# Patient Record
Sex: Female | Born: 1945 | Race: White | Hispanic: No | Smoking: Current every day smoker
Health system: Southern US, Community
[De-identification: ages and names within clinical notes are randomized; demographics above are authoritative.]

## PROBLEM LIST (undated history)

## (undated) DIAGNOSIS — M4316 Spondylolisthesis, lumbar region: Secondary | ICD-10-CM

## (undated) DIAGNOSIS — N200 Calculus of kidney: Secondary | ICD-10-CM

## (undated) DIAGNOSIS — E785 Hyperlipidemia, unspecified: Secondary | ICD-10-CM

## (undated) DIAGNOSIS — Z87440 Personal history of urinary (tract) infections: Secondary | ICD-10-CM

## (undated) DIAGNOSIS — E559 Vitamin D deficiency, unspecified: Secondary | ICD-10-CM

## (undated) DIAGNOSIS — J45909 Unspecified asthma, uncomplicated: Secondary | ICD-10-CM

## (undated) DIAGNOSIS — E876 Hypokalemia: Secondary | ICD-10-CM

## (undated) DIAGNOSIS — M1990 Unspecified osteoarthritis, unspecified site: Secondary | ICD-10-CM

## (undated) DIAGNOSIS — F419 Anxiety disorder, unspecified: Secondary | ICD-10-CM

## (undated) DIAGNOSIS — M48062 Spinal stenosis, lumbar region with neurogenic claudication: Secondary | ICD-10-CM

## (undated) DIAGNOSIS — N952 Postmenopausal atrophic vaginitis: Secondary | ICD-10-CM

## (undated) DIAGNOSIS — F32A Depression, unspecified: Secondary | ICD-10-CM

## (undated) DIAGNOSIS — N3941 Urge incontinence: Secondary | ICD-10-CM

## (undated) DIAGNOSIS — I10 Essential (primary) hypertension: Secondary | ICD-10-CM

## (undated) DIAGNOSIS — M7062 Trochanteric bursitis, left hip: Secondary | ICD-10-CM

## (undated) DIAGNOSIS — N182 Chronic kidney disease, stage 2 (mild): Secondary | ICD-10-CM

## (undated) DIAGNOSIS — E039 Hypothyroidism, unspecified: Secondary | ICD-10-CM

## (undated) HISTORY — PX: TUBAL LIGATION: SHX77

---

## 2021-06-03 ENCOUNTER — Encounter: Payer: Self-pay | Admitting: Neurosurgery

## 2021-06-03 ENCOUNTER — Encounter
Admission: RE | Admit: 2021-06-03 | Discharge: 2021-06-03 | Disposition: A | Discharge status: Home / Self Care | Payer: Medicare Other | Source: Ambulatory Visit | Attending: Neurosurgery | Admitting: Neurosurgery

## 2021-06-03 ENCOUNTER — Other Ambulatory Visit: Payer: Self-pay | Admitting: Neurosurgery

## 2021-06-03 DIAGNOSIS — Z01818 Encounter for other preprocedural examination: Secondary | ICD-10-CM | POA: Insufficient documentation

## 2021-06-03 DIAGNOSIS — N182 Chronic kidney disease, stage 2 (mild): Secondary | ICD-10-CM | POA: Diagnosis not present

## 2021-06-03 DIAGNOSIS — I129 Hypertensive chronic kidney disease with stage 1 through stage 4 chronic kidney disease, or unspecified chronic kidney disease: Secondary | ICD-10-CM | POA: Insufficient documentation

## 2021-06-03 DIAGNOSIS — I10 Essential (primary) hypertension: Secondary | ICD-10-CM

## 2021-06-03 DIAGNOSIS — E876 Hypokalemia: Secondary | ICD-10-CM | POA: Diagnosis not present

## 2021-06-03 DIAGNOSIS — Z01810 Encounter for preprocedural cardiovascular examination: Secondary | ICD-10-CM

## 2021-06-03 DIAGNOSIS — Z01812 Encounter for preprocedural laboratory examination: Secondary | ICD-10-CM

## 2021-06-03 DIAGNOSIS — E039 Hypothyroidism, unspecified: Secondary | ICD-10-CM | POA: Diagnosis not present

## 2021-06-03 DIAGNOSIS — E785 Hyperlipidemia, unspecified: Secondary | ICD-10-CM | POA: Diagnosis not present

## 2021-06-03 DIAGNOSIS — M48062 Spinal stenosis, lumbar region with neurogenic claudication: Secondary | ICD-10-CM | POA: Diagnosis not present

## 2021-06-03 HISTORY — DX: Spinal stenosis, lumbar region with neurogenic claudication: M48.062

## 2021-06-03 HISTORY — DX: Anxiety disorder, unspecified: F41.9

## 2021-06-03 HISTORY — DX: Depression, unspecified: F32.A

## 2021-06-03 HISTORY — DX: Hyperlipidemia, unspecified: E78.5

## 2021-06-03 HISTORY — DX: Unspecified osteoarthritis, unspecified site: M19.90

## 2021-06-03 HISTORY — DX: Unspecified asthma, uncomplicated: J45.909

## 2021-06-03 HISTORY — DX: Hypothyroidism, unspecified: E03.9

## 2021-06-03 HISTORY — DX: Essential (primary) hypertension: I10

## 2021-06-03 LAB — BASIC METABOLIC PANEL
Anion gap: 10 (ref 5–15)
BUN: 13 mg/dL (ref 8–23)
CO2: 27 mmol/L (ref 22–32)
Calcium: 9.6 mg/dL (ref 8.9–10.3)
Chloride: 103 mmol/L (ref 98–111)
Creatinine, Ser: 0.79 mg/dL (ref 0.44–1.00)
GFR, Estimated: >60 mL/min (ref >60)
Glucose, Bld: 98 mg/dL (ref 70–99)
Potassium: 4.2 mmol/L (ref 3.5–5.1)
Sodium: 140 mmol/L (ref 135–145)

## 2021-06-03 LAB — URINALYSIS, COMPLETE (UACMP) WITH MICROSCOPIC
Bacteria, UA: NONE SEEN
Bilirubin Urine: NEGATIVE
Glucose, UA: NEGATIVE mg/dL
Ketones, ur: NEGATIVE mg/dL
Leukocytes,Ua: NEGATIVE
Nitrite: NEGATIVE
Protein, ur: NEGATIVE mg/dL
Specific Gravity, Urine: 1.011 (ref 1.005–1.030)
pH: 6 (ref 5.0–8.0)

## 2021-06-03 LAB — CBC
HCT: 39.6 % (ref 36.0–46.0)
Hemoglobin: 13.3 g/dL (ref 12.0–15.0)
MCH: 30.9 pg (ref 26.0–34.0)
MCHC: 33.6 g/dL (ref 30.0–36.0)
MCV: 92.1 fL (ref 80.0–100.0)
Platelets: 202 10*3/uL (ref 150–400)
RBC: 4.3 MIL/uL (ref 3.87–5.11)
RDW: 12.4 % (ref 11.5–15.5)
WBC: 6.9 10*3/uL (ref 4.0–10.5)
nRBC: 0 % (ref 0.0–0.2)

## 2021-06-03 LAB — TYPE AND SCREEN
ABO/RH(D): A POS
Antibody Screen: NEGATIVE

## 2021-06-03 LAB — SURGICAL PCR SCREEN
MRSA, PCR: NEGATIVE
Staphylococcus aureus: NEGATIVE

## 2021-06-11 ENCOUNTER — Encounter
Admission: RE | Admit: 2021-06-11 | Discharge: 2021-06-11 | Disposition: A | Discharge status: Home / Self Care | Payer: Medicare Other | Source: Ambulatory Visit | Attending: Neurosurgery | Admitting: Neurosurgery

## 2021-06-11 ENCOUNTER — Other Ambulatory Visit: Payer: Self-pay

## 2021-06-11 NOTE — Patient Instructions (Addendum)
Your procedure is scheduled on:5/31/ 2023  Report to the Registration Desk on the 1st floor of the [facility]. To find out your arrival time, please call [phone] between 1PM - 3PM on: 06/17/2021  If your arrival time is 6:00 am, do not arrive prior to that time as the [facility] entrance doors do not open until 6:00 am.  REMEMBER: Instructions that are not followed completely may result in serious medical risk, up to and including death; or upon the discretion of your surgeon and anesthesiologist your surgery may need to be rescheduled.  Do not eat food after midnight the night before surgery.  No gum chewing, lozengers or hard candies.  You may however, drink CLEAR liquids up to 2 hours before you are scheduled to arrive for your surgery. Do not drink anything within 2 hours of your scheduled arrival time.  Clear liquids include: - water  - apple juice without pulp - gatorade (not RED colors) - black coffee or tea (Do NOT add milk or creamers to the coffee or tea) Do NOT drink anything that is not on this list.    TAKE THESE MEDICATIONS THE MORNING OF SURGERY WITH A SIP OF WATER: Wellbutrin Tapazole Omeprazole Lyrica Crestor Zanaflex  DO NOT TAKE LOSARTAN on day of surgery because this can interact with anesthesia.    Use inhaler as prescribed on the day of surgery and bring to the hospital.  Use flonase as prescribed on day of surgery.  . One week prior to surgery: Stop Anti-inflammatories (NSAIDS) such as Advil, Aleve, Ibuprofen, Motrin, Naproxen, Naprosyn and Aspirin based products such as Excedrin, Goodys Powder, BC Powder. Stop ANY OVER THE COUNTER supplements until after surgery. You may however, continue to take Tylenol if needed for pain up until the day of surgery.  No Alcohol for 24 hours before or after surgery.  No Smoking including e-cigarettes for 24 hours prior to surgery.  No chewable tobacco products for at least 6 hours prior to surgery.  No  nicotine patches on the day of surgery.  Do not use any "recreational" drugs for at least a week prior to your surgery.  Please be advised that the combination of cocaine and anesthesia may have negative outcomes, up to and including death. If you test positive for cocaine, your surgery will be cancelled.  On the morning of surgery brush your teeth with toothpaste and water, you may rinse your mouth with mouthwash if you wish. Do not swallow any toothpaste or mouthwash.  Use CHG Soap as directed on instruction sheet.  Do not wear jewelry, make-up, hairpins, clips or nail polish.  Do not wear lotions, powders, or perfumes.   Do not shave body from the neck down 48 hours prior to surgery just in case you cut yourself which could leave a site for infection.  Also, freshly shaved skin may become irritated if using the CHG soap.  Contact lenses, hearing aids and dentures may not be worn into surgery.  Do not bring valuables to the hospital. [facility] is not responsible for any missing/lost belongings or valuables.    Notify your doctor if there is any change in your medical condition (cold, fever, infection).  Wear comfortable clothing (specific to your surgery type) to the hospital.  After surgery, you can help prevent lung complications by doing breathing exercises.  Take deep breaths and cough every 1-2 hours. Your doctor may order a device called an Incentive Spirometer to help you take deep breaths. When  coughing or sneezing, hold a pillow firmly against your incision with both hands. This is called "splinting." Doing this helps protect your incision. It also decreases belly discomfort.  If you are being admitted to the hospital overnight, leave your suitcase in the car. After surgery it may be brought to your room.  If you are being discharged the day of surgery, you will not be allowed to drive home. You will need a responsible adult (18 years or older) to drive you home and  stay with you that night.   If you are taking public transportation, you will need to have a responsible adult (18 years or older) with you. Please confirm with your physician that it is acceptable to use public transportation.   Please call the [facility] at [phone] if you have any questions about these instructions.  Surgery Visitation Policy:  Patients undergoing a surgery or procedure may have two family members or support persons with them as long as the person is not COVID-19 positive or experiencing its symptoms.   Inpatient Visitation:    Visiting hours are 7 a.m. to 8 p.m. Up to four visitors are allowed at one time in a patient room, including children. The visitors may rotate out with other people during the day. One designated support person (adult) may remain overnight.

## 2021-06-18 ENCOUNTER — Ambulatory Visit: Payer: Medicare Other | Admitting: Urgent Care

## 2021-06-18 ENCOUNTER — Encounter: Payer: Self-pay | Admitting: Neurosurgery

## 2021-06-18 ENCOUNTER — Ambulatory Visit: Payer: Medicare Other

## 2021-06-18 ENCOUNTER — Ambulatory Visit
Admission: RE | Admit: 2021-06-18 | Discharge: 2021-06-18 | Disposition: A | Discharge status: Home / Self Care | Payer: Medicare Other | Attending: Neurosurgery | Admitting: Neurosurgery

## 2021-06-18 ENCOUNTER — Other Ambulatory Visit: Payer: Self-pay

## 2021-06-18 ENCOUNTER — Encounter
Admission: RE | Disposition: A | Discharge status: Home / Self Care | Payer: Self-pay | Source: Home / Self Care | Attending: Neurosurgery

## 2021-06-18 DIAGNOSIS — E876 Hypokalemia: Secondary | ICD-10-CM

## 2021-06-18 DIAGNOSIS — I129 Hypertensive chronic kidney disease with stage 1 through stage 4 chronic kidney disease, or unspecified chronic kidney disease: Secondary | ICD-10-CM | POA: Insufficient documentation

## 2021-06-18 DIAGNOSIS — Z01810 Encounter for preprocedural cardiovascular examination: Secondary | ICD-10-CM

## 2021-06-18 DIAGNOSIS — M48062 Spinal stenosis, lumbar region with neurogenic claudication: Secondary | ICD-10-CM | POA: Diagnosis not present

## 2021-06-18 DIAGNOSIS — F17210 Nicotine dependence, cigarettes, uncomplicated: Secondary | ICD-10-CM | POA: Diagnosis not present

## 2021-06-18 DIAGNOSIS — I10 Essential (primary) hypertension: Secondary | ICD-10-CM

## 2021-06-18 DIAGNOSIS — N182 Chronic kidney disease, stage 2 (mild): Secondary | ICD-10-CM | POA: Diagnosis not present

## 2021-06-18 DIAGNOSIS — E039 Hypothyroidism, unspecified: Secondary | ICD-10-CM

## 2021-06-18 DIAGNOSIS — Z01812 Encounter for preprocedural laboratory examination: Secondary | ICD-10-CM

## 2021-06-18 DIAGNOSIS — E785 Hyperlipidemia, unspecified: Secondary | ICD-10-CM

## 2021-06-18 DIAGNOSIS — J449 Chronic obstructive pulmonary disease, unspecified: Secondary | ICD-10-CM | POA: Diagnosis not present

## 2021-06-18 HISTORY — PX: LUMBAR LAMINECTOMY/DECOMPRESSION MICRODISCECTOMY: SHX5026

## 2021-06-18 HISTORY — DX: Urge incontinence: N39.41

## 2021-06-18 HISTORY — DX: Personal history of urinary (tract) infections: Z87.440

## 2021-06-18 HISTORY — DX: Calculus of kidney: N20.0

## 2021-06-18 HISTORY — DX: Chronic kidney disease, stage 2 (mild): N18.2

## 2021-06-18 HISTORY — DX: Trochanteric bursitis, left hip: M70.62

## 2021-06-18 HISTORY — DX: Vitamin D deficiency, unspecified: E55.9

## 2021-06-18 HISTORY — DX: Postmenopausal atrophic vaginitis: N95.2

## 2021-06-18 HISTORY — DX: Spondylolisthesis, lumbar region: M43.16

## 2021-06-18 HISTORY — DX: Hypokalemia: E87.6

## 2021-06-18 LAB — TYPE AND SCREEN
ABO/RH(D): A POS
Antibody Screen: NEGATIVE

## 2021-06-18 SURGERY — LUMBAR LAMINECTOMY/DECOMPRESSION MICRODISCECTOMY 1 LEVEL
Anesthesia: General

## 2021-06-18 MED ORDER — ROCURONIUM BROMIDE 100 MG/10ML IV SOLN
INTRAVENOUS | Status: DC | PRN
  Administered 2021-06-18: 10 mg via INTRAVENOUS

## 2021-06-18 MED ORDER — BUPIVACAINE LIPOSOME 1.3 % IJ SUSP
INTRAMUSCULAR | Status: AC
Start: 2021-06-18 — End: ?
  Filled 2021-06-18: qty 20

## 2021-06-18 MED ORDER — PROPOFOL 10 MG/ML IV BOLUS
INTRAVENOUS | Status: DC | PRN
  Administered 2021-06-18: 160 mg via INTRAVENOUS
  Administered 2021-06-18: 40 mg via INTRAVENOUS

## 2021-06-18 MED ORDER — TIZANIDINE HCL 2 MG PO CAPS: 2.0000 mg | ORAL_CAPSULE | Freq: Three times a day (TID) | ORAL | 0 refills | Status: AC

## 2021-06-18 MED ORDER — GLYCOPYRROLATE 0.2 MG/ML IJ SOLN
INTRAMUSCULAR | Status: AC
Start: 2021-06-18 — End: ?
  Filled 2021-06-18: qty 1

## 2021-06-18 MED ORDER — CEFAZOLIN SODIUM-DEXTROSE 2-4 GM/100ML-% IV SOLN
2.0000 g | INTRAVENOUS | Status: AC
  Administered 2021-06-18: 2 g via INTRAVENOUS

## 2021-06-18 MED ORDER — EPHEDRINE 5 MG/ML INJ
INTRAVENOUS | Status: AC
  Filled 2021-06-18: qty 5

## 2021-06-18 MED ORDER — CHLORHEXIDINE GLUCONATE 0.12 % MT SOLN: 15.0000 mL | Freq: Once | OROMUCOSAL | Status: AC

## 2021-06-18 MED ORDER — KETAMINE HCL 10 MG/ML IJ SOLN
INTRAMUSCULAR | Status: DC | PRN
  Administered 2021-06-18 (×2): 25 mg via INTRAVENOUS

## 2021-06-18 MED ORDER — ACETAMINOPHEN 10 MG/ML IV SOLN
INTRAVENOUS | Status: AC
  Filled 2021-06-18: qty 100

## 2021-06-18 MED ORDER — SODIUM CHLORIDE FLUSH 0.9 % IV SOLN
INTRAVENOUS | Status: AC
  Filled 2021-06-18: qty 20

## 2021-06-18 MED ORDER — FENTANYL CITRATE (PF) 100 MCG/2ML IJ SOLN: 25.0000 ug | INTRAMUSCULAR | Status: DC | PRN

## 2021-06-18 MED ORDER — BUPIVACAINE HCL (PF) 0.5 % IJ SOLN
INTRAMUSCULAR | Status: AC
  Filled 2021-06-18: qty 30

## 2021-06-18 MED ORDER — EPHEDRINE SULFATE (PRESSORS) 50 MG/ML IJ SOLN
INTRAMUSCULAR | Status: DC | PRN
  Administered 2021-06-18 (×2): 10 mg via INTRAVENOUS
  Administered 2021-06-18: 5 mg via INTRAVENOUS

## 2021-06-18 MED ORDER — DEXAMETHASONE SODIUM PHOSPHATE 10 MG/ML IJ SOLN
INTRAMUSCULAR | Status: AC
  Filled 2021-06-18: qty 1

## 2021-06-18 MED ORDER — METHYLPREDNISOLONE ACETATE 40 MG/ML IJ SUSP
INTRAMUSCULAR | Status: AC
  Filled 2021-06-18: qty 1

## 2021-06-18 MED ORDER — SUCCINYLCHOLINE CHLORIDE 200 MG/10ML IV SOSY
PREFILLED_SYRINGE | INTRAVENOUS | Status: DC | PRN
  Administered 2021-06-18: 100 mg via INTRAVENOUS

## 2021-06-18 MED ORDER — PHENYLEPHRINE HCL-NACL 20-0.9 MG/250ML-% IV SOLN
INTRAVENOUS | Status: DC | PRN
  Administered 2021-06-18: 50 ug/min via INTRAVENOUS

## 2021-06-18 MED ORDER — LIDOCAINE HCL (CARDIAC) PF 100 MG/5ML IV SOSY
PREFILLED_SYRINGE | INTRAVENOUS | Status: DC | PRN
  Administered 2021-06-18: 50 mg via INTRAVENOUS

## 2021-06-18 MED ORDER — SEVOFLURANE IN SOLN
RESPIRATORY_TRACT | Status: AC
Start: 2021-06-18 — End: ?
  Filled 2021-06-18: qty 250

## 2021-06-18 MED ORDER — PHENYLEPHRINE 80 MCG/ML (10ML) SYRINGE FOR IV PUSH (FOR BLOOD PRESSURE SUPPORT)
PREFILLED_SYRINGE | INTRAVENOUS | Status: AC
  Filled 2021-06-18: qty 10

## 2021-06-18 MED ORDER — ONDANSETRON HCL 4 MG/2ML IJ SOLN
INTRAMUSCULAR | Status: AC
  Filled 2021-06-18: qty 2

## 2021-06-18 MED ORDER — OXYCODONE HCL 5 MG PO TABS: 5.0000 mg | ORAL_TABLET | Freq: Once | ORAL | Status: DC | PRN

## 2021-06-18 MED ORDER — 0.9 % SODIUM CHLORIDE (POUR BTL) OPTIME
TOPICAL | Status: DC | PRN
  Administered 2021-06-18: 500 mL

## 2021-06-18 MED ORDER — BUPIVACAINE LIPOSOME 1.3 % IJ SUSP
INTRAMUSCULAR | Status: AC
  Filled 2021-06-18: qty 20

## 2021-06-18 MED ORDER — ORAL CARE MOUTH RINSE: 15.0000 mL | Freq: Once | OROMUCOSAL | Status: AC

## 2021-06-18 MED ORDER — BUPIVACAINE-EPINEPHRINE (PF) 0.5% -1:200000 IJ SOLN
INTRAMUSCULAR | Status: AC
  Filled 2021-06-18: qty 30

## 2021-06-18 MED ORDER — DEXAMETHASONE SODIUM PHOSPHATE 10 MG/ML IJ SOLN
INTRAMUSCULAR | Status: DC | PRN
  Administered 2021-06-18: 10 mg via INTRAVENOUS

## 2021-06-18 MED ORDER — OXYCODONE-ACETAMINOPHEN 5-325 MG PO TABS: 1.0000 | ORAL_TABLET | Freq: Four times a day (QID) | ORAL | 0 refills | Status: AC | PRN

## 2021-06-18 MED ORDER — FENTANYL CITRATE (PF) 100 MCG/2ML IJ SOLN
INTRAMUSCULAR | Status: AC
  Filled 2021-06-18: qty 2

## 2021-06-18 MED ORDER — KETAMINE HCL 50 MG/5ML IJ SOSY
PREFILLED_SYRINGE | INTRAMUSCULAR | Status: AC
  Filled 2021-06-18: qty 5

## 2021-06-18 MED ORDER — ONDANSETRON HCL 4 MG/2ML IJ SOLN
INTRAMUSCULAR | Status: DC | PRN
  Administered 2021-06-18: 4 mg via INTRAVENOUS

## 2021-06-18 MED ORDER — ACETAMINOPHEN 10 MG/ML IV SOLN
INTRAVENOUS | Status: DC | PRN
  Administered 2021-06-18: 1000 mg via INTRAVENOUS

## 2021-06-18 MED ORDER — MIDAZOLAM HCL 2 MG/2ML IJ SOLN
INTRAMUSCULAR | Status: DC | PRN
  Administered 2021-06-18: 2 mg via INTRAVENOUS

## 2021-06-18 MED ORDER — CEFAZOLIN SODIUM-DEXTROSE 2-4 GM/100ML-% IV SOLN
INTRAVENOUS | Status: AC
  Filled 2021-06-18: qty 100

## 2021-06-18 MED ORDER — GLYCOPYRROLATE 0.2 MG/ML IJ SOLN
INTRAMUSCULAR | Status: DC | PRN
  Administered 2021-06-18: .2 mg via INTRAVENOUS

## 2021-06-18 MED ORDER — SENNA 8.6 MG PO TABS
1.0000 | ORAL_TABLET | Freq: Every day | ORAL | 0 refills | Status: DC | PRN
Start: 2021-06-18 — End: 2021-07-31

## 2021-06-18 MED ORDER — SODIUM CHLORIDE (PF) 0.9 % IJ SOLN
INTRAMUSCULAR | Status: DC | PRN
  Administered 2021-06-18: 60 mL

## 2021-06-18 MED ORDER — PHENYLEPHRINE HCL (PRESSORS) 10 MG/ML IV SOLN
INTRAVENOUS | Status: DC | PRN
  Administered 2021-06-18: 160 ug via INTRAVENOUS
  Administered 2021-06-18: 80 ug via INTRAVENOUS
  Administered 2021-06-18 (×2): 160 ug via INTRAVENOUS
  Administered 2021-06-18: 80 ug via INTRAVENOUS

## 2021-06-18 MED ORDER — FENTANYL CITRATE (PF) 100 MCG/2ML IJ SOLN
INTRAMUSCULAR | Status: DC | PRN
  Administered 2021-06-18 (×2): 50 ug via INTRAVENOUS

## 2021-06-18 MED ORDER — ROCURONIUM BROMIDE 10 MG/ML (PF) SYRINGE
PREFILLED_SYRINGE | INTRAVENOUS | Status: AC
  Filled 2021-06-18: qty 10

## 2021-06-18 MED ORDER — SURGIFLO WITH THROMBIN (HEMOSTATIC MATRIX KIT) OPTIME
TOPICAL | Status: DC | PRN
  Administered 2021-06-18: 1 via TOPICAL

## 2021-06-18 MED ORDER — MIDAZOLAM HCL 2 MG/2ML IJ SOLN
INTRAMUSCULAR | Status: AC
  Filled 2021-06-18: qty 2

## 2021-06-18 MED ORDER — METHYLPREDNISOLONE ACETATE 40 MG/ML IJ SUSP
INTRAMUSCULAR | Status: DC | PRN
  Administered 2021-06-18: 40 mg

## 2021-06-18 MED ORDER — BUPIVACAINE-EPINEPHRINE (PF) 0.5% -1:200000 IJ SOLN
INTRAMUSCULAR | Status: DC | PRN
  Administered 2021-06-18: 4 mL

## 2021-06-18 MED ORDER — OXYCODONE HCL 5 MG/5ML PO SOLN: 5.0000 mg | Freq: Once | ORAL | Status: DC | PRN

## 2021-06-18 MED ORDER — LACTATED RINGERS IV SOLN: INTRAVENOUS | Status: DC

## 2021-06-18 MED ORDER — CHLORHEXIDINE GLUCONATE 0.12 % MT SOLN
OROMUCOSAL | Status: AC
  Administered 2021-06-18: 15 mL via OROMUCOSAL
  Filled 2021-06-18: qty 15

## 2021-06-18 MED ORDER — DEXMEDETOMIDINE HCL IN NACL 200 MCG/50ML IV SOLN
INTRAVENOUS | Status: DC | PRN
  Administered 2021-06-18: 12 ug via INTRAVENOUS

## 2021-06-18 SURGICAL SUPPLY — 55 items
BUR NEURO DRILL SOFT 3.0X3.8M (BURR) ×2 IMPLANT
CHLORAPREP W/TINT 26 (MISCELLANEOUS) ×4 IMPLANT
CNTNR SPEC 2.5X3XGRAD LEK (MISCELLANEOUS) ×1
CONT SPEC 4OZ STER OR WHT (MISCELLANEOUS) ×1
CONTAINER SPEC 2.5X3XGRAD LEK (MISCELLANEOUS) ×1 IMPLANT
COUNTER NEEDLE 20/40 LG (NEEDLE) ×2 IMPLANT
CUP MEDICINE 2OZ PLAST GRAD ST (MISCELLANEOUS) ×4 IMPLANT
DERMABOND ADVANCED (GAUZE/BANDAGES/DRESSINGS) ×1
DERMABOND ADVANCED .7 DNX12 (GAUZE/BANDAGES/DRESSINGS) ×1 IMPLANT
DRAPE C ARM PK CFD 31 SPINE (DRAPES) ×2 IMPLANT
DRAPE LAPAROTOMY 100X77 ABD (DRAPES) ×2 IMPLANT
DRAPE MICROSCOPE SPINE 48X150 (DRAPES) ×2 IMPLANT
DRAPE SURG 17X11 SM STRL (DRAPES) ×2 IMPLANT
ELECT CAUTERY BLADE TIP 2.5 (TIP) ×2
ELECT EZSTD 165MM 6.5IN (MISCELLANEOUS)
ELECT REM PT RETURN 9FT ADLT (ELECTROSURGICAL) ×2
ELECTRODE CAUTERY BLDE TIP 2.5 (TIP) ×1 IMPLANT
ELECTRODE EZSTD 165MM 6.5IN (MISCELLANEOUS) IMPLANT
ELECTRODE REM PT RTRN 9FT ADLT (ELECTROSURGICAL) ×1 IMPLANT
GLOVE BIOGEL PI IND STRL 6.5 (GLOVE) ×1 IMPLANT
GLOVE BIOGEL PI IND STRL 8.5 (GLOVE) ×1 IMPLANT
GLOVE BIOGEL PI INDICATOR 6.5 (GLOVE) ×1
GLOVE BIOGEL PI INDICATOR 8.5 (GLOVE) ×1
GLOVE SURG SYN 6.5 ES PF (GLOVE) ×4 IMPLANT
GLOVE SURG SYN 6.5 PF PI (GLOVE) ×2 IMPLANT
GLOVE SURG SYN 8.5  E (GLOVE) ×3
GLOVE SURG SYN 8.5 E (GLOVE) ×3 IMPLANT
GLOVE SURG SYN 8.5 PF PI (GLOVE) ×3 IMPLANT
GOWN SRG LRG LVL 4 IMPRV REINF (GOWNS) ×1 IMPLANT
GOWN SRG XL LVL 3 NONREINFORCE (GOWNS) ×1 IMPLANT
GOWN STRL NON-REIN TWL XL LVL3 (GOWNS) ×1
GOWN STRL REIN LRG LVL4 (GOWNS) ×1
GRADUATE 1200CC STRL 31836 (MISCELLANEOUS) ×2 IMPLANT
GRAFT DURAGEN MATRIX 1WX1L (Tissue) IMPLANT
KIT SPINAL PRONEVIEW (KITS) ×2 IMPLANT
MANIFOLD NEPTUNE II (INSTRUMENTS) ×2 IMPLANT
MARKER SKIN DUAL TIP RULER LAB (MISCELLANEOUS) ×4 IMPLANT
NDL SAFETY ECLIPSE 18X1.5 (NEEDLE) ×1 IMPLANT
NEEDLE HYPO 18GX1.5 SHARP (NEEDLE) ×1
NEEDLE HYPO 22GX1.5 SAFETY (NEEDLE) ×2 IMPLANT
NS IRRIG 1000ML POUR BTL (IV SOLUTION) ×2 IMPLANT
PACK LAMINECTOMY NEURO (CUSTOM PROCEDURE TRAY) ×2 IMPLANT
PAD ARMBOARD 7.5X6 YLW CONV (MISCELLANEOUS) ×2 IMPLANT
SOLUTION IRRIG SURGIPHOR (IV SOLUTION) ×2 IMPLANT
SURGIFLO W/THROMBIN 8M KIT (HEMOSTASIS) ×2 IMPLANT
SUT DVC VLOC 3-0 CL 6 P-12 (SUTURE) ×2 IMPLANT
SUT VIC AB 0 CT1 27 (SUTURE) ×1
SUT VIC AB 0 CT1 27XCR 8 STRN (SUTURE) ×1 IMPLANT
SUT VIC AB 2-0 CT1 18 (SUTURE) ×2 IMPLANT
SYR 10ML LL (SYRINGE) ×2 IMPLANT
SYR 20ML LL LF (SYRINGE) ×2 IMPLANT
SYR 30ML LL (SYRINGE) ×4 IMPLANT
SYR 3ML LL SCALE MARK (SYRINGE) ×2 IMPLANT
TOWEL OR 17X26 4PK STRL BLUE (TOWEL DISPOSABLE) ×6 IMPLANT
TUBING CONNECTING 10 (TUBING) ×2 IMPLANT

## 2021-06-18 NOTE — Transfer of Care (Signed)
Immediate Anesthesia Transfer of Care Note  Patient: Suzanne Herman  Procedure(s) Performed: L4-5 DECOMPRESSION  Patient Location: PACU  Anesthesia Type:General  Level of Consciousness: sedated  Airway & Oxygen Therapy: Patient Spontanous Breathing and Patient connected to face mask oxygen  Post-op Assessment: Report given to RN and Post -op Vital signs reviewed and stable  Post vital signs: Reviewed  Last Vitals:  Vitals Value Taken Time  BP 127/62 06/18/21 0859  Temp    Pulse 99 06/18/21 0859  Resp 17 06/18/21 0859  SpO2 96 % 06/18/21 0859  Vitals shown include unvalidated device data.  Last Pain:         Complications: No notable events documented.

## 2021-06-18 NOTE — Op Note (Signed)
Indications: Suzanne Herman is a 76 yo female who presented with: Neurogenic claudication due to lumbar spinal stenosis M48.062  She failed conservative management prompting surgical intervention.  Findings: decompression at L4-5  Preoperative Diagnosis: Lumbar Stenosis with neurogenic claudication Postoperative Diagnosis: same   EBL: 10 ml IVF: 800 ml Drains: none Disposition: Extubated and Stable to PACU Complications: none  No foley catheter was placed.   Preoperative Note:   Risks of surgery discussed include: infection, bleeding, stroke, coma, death, paralysis, CSF leak, nerve/spinal cord injury, numbness, tingling, weakness, complex regional pain syndrome, recurrent stenosis and/or disc herniation, vascular injury, development of instability, neck/back pain, need for further surgery, persistent symptoms, development of deformity, and the risks of anesthesia. The patient understood these risks and agreed to proceed.  Operative Note:   1. L4-5 lumbar decompression including central laminectomy and bilateral medial facetectomies including foraminotomies  The patient was then brought from the preoperative center with intravenous access established.  The patient underwent general anesthesia and endotracheal tube intubation, and was then rotated on the Dodge rail top where all pressure points were appropriately padded.  The skin was then thoroughly cleansed.  Perioperative antibiotic prophylaxis was administered.  Sterile prep and drapes were then applied and a timeout was then observed.  C-arm was brought into the field under sterile conditions and under lateral visualization the L4-5 interspace was identified and marked.  The incision was marked on the left and injected with local anesthetic. Once this was complete a 3 cm incision was opened with the use of a #10 blade knife.    The metrx tubes were sequentially advanced and confirmed in position at L4-5. An 27mm by 71mm tube was  locked in place to the bed side attachment.  The microscope was then sterilely brought into the field and muscle creep was hemostased with a bipolar and resected with a pituitary rongeur.  A Bovie extender was then used to expose the spinous process and lamina.  Careful attention was placed to not violate the facet capsule. A 3 mm matchstick drill bit was then used to make a hemi-laminotomy trough until the ligamentum flavum was exposed.  This was extended to the base of the spinous process and to the contralateral side to remove all the central bone from each side.  Once this was complete and the underlying ligamentum flavum was visualized, it was dissected with a curette and resected with Kerrison rongeurs.  Extensive ligamentum hypertrophy was noted, requiring a substantial amount of time and care for removal.  The dura was identified and palpated. The kerrison rongeur was then used to remove the medial facet bilaterally until no compression was noted.  A balltip probe was used to confirm decompression of the ipsilateral L5 nerve root.  Additional attention was paid to completion of the contralateral L4-5 foraminotomy until the contralateral traversing nerve root was completely free.  Once this was complete, L4-5 central decompression including medial facetectomy and foraminotomy was confirmed and decompression on both sides was confirmed. No CSF leak was noted.  A Depo-Medrol soaked Gelfoam pledget was placed in the defect.  The wound was copiously irrigated. The tube system was then removed under microscopic visualization and hemostasis was obtained with a bipolar.    The fascial layer was reapproximated with the use of a 0 Vicryl suture.  Subcutaneous tissue layer was reapproximated using 2-0 Vicryl suture.  3-0 monocryl was placed in subcuticular fashion. The skin was then cleansed and Dermabond was used to close the skin opening.  Patient was then rotated back to the preoperative bed awakened from  anesthesia and taken to recovery all counts are correct in this case.  I performed the entire procedure with the assistance of Manning Charity PA as an Designer, television/film set.  Nomar Broad K. Myer Haff MD

## 2021-06-18 NOTE — Progress Notes (Signed)
PHARMACY -  BRIEF ANTIBIOTIC NOTE   Pharmacy has received consult(s) for Cefazolin from an OR provider.  The patient's profile has been reviewed for ht/wt/allergies/indication/available labs.    One time order(s) placed for Cefazolin 2 gm per pt wt: 68.9 kg  Further antibiotics/pharmacy consults should be ordered by admitting physician if indicated.                       Thank you, Otelia Sergeant, PharmD, [facility] 06/18/2021 1:24 AM

## 2021-06-18 NOTE — H&P (Signed)
I have reviewed and confirmed my history and physical from 06/03/21 with no additions or changes. Plan for L4-5 decompression.  Risks and benefits reviewed.  Heart sounds normal no MRG. Chest Clear to Auscultation Bilaterally.

## 2021-06-18 NOTE — Anesthesia Procedure Notes (Signed)
Procedure Name: Intubation Date/Time: 06/18/2021 7:35 AM Performed by: Rolla Plate, CRNA Pre-anesthesia Checklist: Patient identified, Patient being monitored, Timeout performed, Emergency Drugs available and Suction available Patient Re-evaluated:Patient Re-evaluated prior to induction Oxygen Delivery Method: Circle system utilized Preoxygenation: Pre-oxygenation with 100% oxygen Induction Type: IV induction Ventilation: Mask ventilation without difficulty Laryngoscope Size: 3 and McGraph Grade View: Grade I Tube type: Oral Tube size: 6.5 mm Number of attempts: 1 Airway Equipment and Method: Stylet and Video-laryngoscopy Placement Confirmation: ETT inserted through vocal cords under direct vision, positive ETCO2 and breath sounds checked- equal and bilateral Secured at: 21 cm Tube secured with: Tape Dental Injury: Teeth and Oropharynx as per pre-operative assessment

## 2021-06-18 NOTE — Discharge Summary (Signed)
Physician Discharge Summary  Patient ID: Suzanne Herman MRN: [id number] DOB/AGE: [birth year] 76 y.o.  Admit date: 06/18/2021 Discharge date: 06/18/2021  Admission Diagnoses: Neurogenic claudication due to lumbar spinal stenosis M48.062  Discharge Diagnoses:  Active Problems:   * No active hospital problems. *   Discharged Condition: good  Hospital Course:  Suzanne Herman is a 76 y.o s/p L4-5 lumbar decompression. Her intraoperative course was uncomplicated. She was monitored in PACU postoperatively and discharged home after ambulating, urinating, and tolerating PO intake. She was given prescriptions for pain medication and muscle relaxers.   Consults: None  Significant Diagnostic Studies: none  Treatments: surgery: as above. Please see separately dictated operative report for further details  Discharge Exam: Blood pressure 127/71, pulse 78, temperature 97.7 F (36.5 C), temperature source Temporal, resp. rate 16, height 5\' 1"  (1.549 m), weight 68.9 kg, SpO2 97 %. CN II-XII grossly intact 5/5 throughout BLE Incision c/d/I with dressing in place  Disposition: Discharge disposition: 01-Home or Self Care       Discharge Instructions     Increase activity slowly   Complete by: As directed       Allergies as of 06/18/2021       Reactions   Sulfa Antibiotics Rash        Medication List     TAKE these medications    albuterol 108 (90 Base) MCG/ACT inhaler Commonly known as: VENTOLIN HFA Inhale 1-2 puffs into the lungs every 4 (four) hours as needed for wheezing or shortness of breath.   buPROPion 150 MG 24 hr tablet Commonly known as: WELLBUTRIN XL Take 150 mg by mouth daily.   fluticasone 50 MCG/ACT nasal spray Commonly known as: FLONASE Place 1 spray into both nostrils daily.   ibuprofen 400 MG tablet Commonly known as: ADVIL Take 400 mg by mouth every 6 (six) hours as needed.   losartan 50 MG tablet Commonly known as: COZAAR Take 50 mg by  mouth daily.   methimazole 5 MG tablet Commonly known as: TAPAZOLE Take 5 mg by mouth daily.   omeprazole 40 MG capsule Commonly known as: PRILOSEC Take 40 mg by mouth daily.   oxyCODONE-acetaminophen 5-325 MG tablet Commonly known as: Percocet Take 1 tablet by mouth every 6 (six) hours as needed for up to 5 days for severe pain.   pregabalin 50 MG capsule Commonly known as: LYRICA Take 50 mg by mouth daily.   rosuvastatin 10 MG tablet Commonly known as: CRESTOR Take 10 mg by mouth daily.   senna 8.6 MG Tabs tablet Commonly known as: SENOKOT Take 1 tablet (8.6 mg total) by mouth daily as needed.   tizanidine 2 MG capsule Commonly known as: Zanaflex Take 1 capsule (2 mg total) by mouth 3 (three) times daily. What changed:  medication strength how much to take when to take this        Follow-up Information     06/20/2021, PA Follow up in 2 week(s).   Why: for post-op and incision check. This appointment date and time is on your pre-op paperwork Contact information: [address] [place] [phone]                 Signed: [phone] 06/18/2021, 10:42 AM

## 2021-06-18 NOTE — Anesthesia Preprocedure Evaluation (Signed)
Anesthesia Evaluation  Patient identified by MRN, date of birth, ID band Patient awake    Reviewed: Allergy & Precautions, NPO status , Patient's Chart, lab work & pertinent test results  History of Anesthesia Complications Negative for: history of anesthetic complications  Airway Mallampati: III  TM Distance: <3 FB Neck ROM: full    Dental  (+) Chipped, Poor Dentition, Missing   Pulmonary asthma , sleep apnea , COPD, Current Smoker,    Pulmonary exam normal        Cardiovascular Exercise Tolerance: Good hypertension, (-) angina(-) Past MI and (-) DOE Normal cardiovascular exam     Neuro/Psych PSYCHIATRIC DISORDERS negative neurological ROS     GI/Hepatic negative GI ROS, Neg liver ROS,   Endo/Other  Hypothyroidism   Renal/GU CRFRenal disease     Musculoskeletal   Abdominal   Peds  Hematology negative hematology ROS (+)   Anesthesia Other Findings Past Medical History: No date: Anterolisthesis of lumbar spine No date: Anxiety No date: Asthma No date: 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), stage II No date: Depression No date: History of recurrent UTI (urinary tract infection) No date: HLD (hyperlipidemia) No date: HTN (hypertension) No date: Hypothyroidism No date: Nephrolithiasis No date: Osteoarthritis No date: Spinal stenosis of lumbar region with neurogenic claudication No date: Trochanteric bursitis of left hip No date: Urge incontinence No date: Vitamin D deficiency  Past Surgical History: No date: TUBAL LIGATION  BMI    Body Mass Index: 28.72 kg/m      Reproductive/Obstetrics negative OB ROS                             Anesthesia Physical Anesthesia Plan  ASA: 3  Anesthesia Plan: General ETT   Post-op Pain Management:    Induction: Intravenous  PONV Risk Score and Plan: Ondansetron, Dexamethasone, Midazolam  and Treatment may vary due to age or medical condition  Airway Management Planned: Oral ETT  Additional Equipment:   Intra-op Plan:   Post-operative Plan: Extubation in OR  Informed Consent: I have reviewed the patients History and Physical, chart, labs and discussed the procedure including the risks, benefits and alternatives for the proposed anesthesia with the patient or authorized representative who has indicated his/her understanding and acceptance.     Dental Advisory Given  Plan Discussed with: Anesthesiologist, CRNA and Surgeon  Anesthesia Plan Comments: (Patient consented for risks of anesthesia including but not limited to:  - adverse reactions to medications - damage to eyes, teeth, lips or other oral mucosa - nerve damage due to positioning  - sore throat or hoarseness - Damage to heart, brain, nerves, lungs, other parts of body or loss of life  Patient voiced understanding.)        Anesthesia Quick Evaluation

## 2021-06-18 NOTE — Anesthesia Postprocedure Evaluation (Signed)
Anesthesia Post Note  Patient: Suzanne Herman  Procedure(s) Performed: L4-5 DECOMPRESSION  Patient location during evaluation: PACU Anesthesia Type: General Level of consciousness: awake and alert Pain management: pain level controlled Vital Signs Assessment: post-procedure vital signs reviewed and stable Respiratory status: spontaneous breathing, nonlabored ventilation, respiratory function stable and patient connected to nasal cannula oxygen Cardiovascular status: blood pressure returned to baseline and stable Postop Assessment: no apparent nausea or vomiting Anesthetic complications: no   No notable events documented.   Last Vitals:  Vitals:   06/18/21 0952 06/18/21 1046  BP: 127/71 127/68  Pulse: 78 79  Resp: 16 16  Temp: 36.5 C 36.5 C  SpO2: 97% 95%    Last Pain:  Vitals:   06/18/21 1046  TempSrc: Temporal  PainSc: 0-No pain                 Cleda Mccreedy [place]

## 2021-06-18 NOTE — Discharge Instructions (Addendum)
?Your surgeon has performed an operation on your lumbar spine (low back) to relieve pressure on one or more nerves. Many times, patients feel better immediately after surgery and can ?overdo it.? Even if you feel well, it is important that you follow these activity guidelines. If you do not let your back heal properly from the surgery, you can increase the chance of a disc herniation and/or return of your symptoms. The following are instructions to help in your recovery once you have been discharged from the hospital. ? ? ?Activity  ?  ?No bending, lifting, or twisting (?BLT?). Avoid lifting objects heavier than 10 pounds (gallon milk jug).  Where possible, avoid household activities that involve lifting, bending, pushing, or pulling such as laundry, vacuuming, grocery shopping, and childcare. Try to arrange for help from friends and family for these activities while your back heals. ? ?Increase physical activity slowly as tolerated.  Taking short walks is encouraged, but avoid strenuous exercise. Do not jog, run, bicycle, lift weights, or participate in any other exercises unless specifically allowed by your doctor. Avoid prolonged sitting, including car rides. ? ?Talk to your doctor before resuming sexual activity. ? ?You should not drive until cleared by your doctor. ? ?Until released by your doctor, you should not return to work or school.  You should rest at home and let your body heal.  ? ?You may shower two days after your surgery.  After showering, lightly dab your incision dry. Do not take a tub bath or go swimming for 3 weeks, or until approved by your doctor at your follow-up appointment. ? ?If you smoke, we strongly recommend that you quit.  Smoking has been proven to interfere with normal healing in your back and will dramatically reduce the success rate of your surgery. Please contact [facility] (800-QUIT-NOW) and use the resources at www.[facility].com for assistance in stopping smoking. ? ?Surgical  Incision ?  ?If you have a dressing on your incision, you may remove it three days after your surgery. Keep your incision area clean and dry. ? ?Your incision was closed with Dermabond glue. The glue should begin to peel away within about a week. ? ?Diet          ? ? You may return to your usual diet. Be sure to stay hydrated. ? ?When to Contact Us ? ?Although your surgery and recovery will likely be uneventful, you may have some residual numbness, aches, and pains in your back and/or legs. This is normal and should improve in the next few weeks. ? ?However, should you experience any of the following, contact us immediately: ?New numbness or weakness ?Pain that is progressively getting worse, and is not relieved by your pain medications or rest ?Bleeding, redness, swelling, pain, or drainage from surgical incision ?Chills or flu-like symptoms ?Fever greater than 101.0 F (38.3 C) ?Problems with bowel or bladder functions ?Difficulty breathing or shortness of breath ?Warmth, tenderness, or swelling in your calf ? ?Contact Information ?During office hours (Monday-Friday 9 am to 5 pm), please call your physician at [phone] ?After hours and weekends, please call [phone] and speak with the answering service, who will contact the doctor on call.  If that fails, call the [facility] Operator at [phone] and ask for the Neurosurgery Resident On Call  ?For a life-threatening emergency, call 911  ? ?AMBULATORY SURGERY  ?DISCHARGE INSTRUCTIONS ? ? ?The drugs that you were given will stay in your system until tomorrow so for the next 24 hours you should   not: ? ?Drive an automobile ?Make any legal decisions ?Drink any alcoholic beverage ? ? ?You may resume regular meals tomorrow.  Today it is better to start with liquids and gradually work up to solid foods. ? ?You may eat anything you prefer, but it is better to start with liquids, then soup and crackers, and gradually work up to solid foods. ? ? ?Please notify your  doctor immediately if you have any unusual bleeding, trouble breathing, redness and pain at the surgery site, drainage, fever, or pain not relieved by medication. ? ? ? ?Additional Instructions: ? ? ? ? ? ? ? ?Please contact your physician with any problems or Same Day Surgery at [phone], Monday through Friday 6 am to 4 pm, or [facility] Main number at [phone].  ?

## 2021-07-31 ENCOUNTER — Ambulatory Visit: Payer: Medicare Other | Admitting: Neurosurgery

## 2021-07-31 ENCOUNTER — Encounter: Payer: Self-pay | Admitting: Neurosurgery

## 2021-07-31 VITALS — BP 148/98 | Ht 61.0 in | Wt 149.0 lb

## 2021-07-31 DIAGNOSIS — M48062 Spinal stenosis, lumbar region with neurogenic claudication: Secondary | ICD-10-CM

## 2021-07-31 NOTE — Progress Notes (Signed)
   DOS: 06/18/21 (L4-5 decompression)  HISTORY OF PRESENT ILLNESS: 07/31/2021 [name] is status post L4 of decompression.  She is doing extremely well.   PHYSICAL EXAMINATION:   Vitals:   07/31/21 1147  BP: (!) 148/98   General: Patient is well developed, well nourished, calm, collected, and in no apparent distress.  NEUROLOGICAL:  General: In no acute distress.  Awake, alert, oriented to person, place, and time. Pupils equal round and reactive to light.   Strength:  Side Iliopsoas Quads Hamstring PF DF EHL  R 5 5 5 5 5 5   L 5 5 5 5 5 5    Incision c/d/i   ROS (Neurologic): Negative except as noted above  IMAGING: No interval imaging to review   ASSESSMENT/PLAN:  Suzanne Herman is doing well after lumbar decompression.  I am very pleased with her improvements.  She will begin increasing her activity level.  We reviewed her limitations.  I will see her back in 6 weeks.  I spent a total of 10 minutes in face-to-face and non-face-to-face activities related to this patient's care today.   MD, [facility] Department of Neurosurgery

## 2021-08-25 ENCOUNTER — Encounter: Payer: Self-pay | Admitting: Neurosurgery

## 2021-09-11 ENCOUNTER — Other Ambulatory Visit: Payer: Self-pay

## 2021-09-11 ENCOUNTER — Ambulatory Visit
Admission: RE | Admit: 2021-09-11 | Discharge: 2021-09-11 | Disposition: A | Discharge status: Home / Self Care | Payer: Self-pay | Source: Ambulatory Visit | Attending: Neurosurgery | Admitting: Neurosurgery

## 2021-09-11 DIAGNOSIS — Z049 Encounter for examination and observation for unspecified reason: Secondary | ICD-10-CM

## 2021-09-18 ENCOUNTER — Ambulatory Visit (INDEPENDENT_AMBULATORY_CARE_PROVIDER_SITE_OTHER): Payer: Medicare Other | Admitting: Neurosurgery

## 2021-09-18 DIAGNOSIS — M48062 Spinal stenosis, lumbar region with neurogenic claudication: Secondary | ICD-10-CM

## 2021-09-18 NOTE — Progress Notes (Signed)
   DOS: 06/18/21 (L4-5 decompression)  HISTORY OF PRESENT ILLNESS: 09/18/2021 She is walking up to 2 miles a day.  She feels good.  Ibuprofen takes care of her pain.  She does wake up with some pain when she is sleeping. She is much better than prior to surgery.  Her mother recently passed away.  She was 94.   08-18-21 [name] is status post L4-5 decompression.  She is doing extremely well.   PHYSICAL EXAMINATION:  No exam performed   IMAGING: No interval imaging to review   ASSESSMENT/PLAN:  Suzanne Herman is doing well after lumbar decompression.  I am very pleased with her improvements.    She has made significant improvements.  She is off activity restrictions.   I will see her back as need.   This visit was performed via telephone.  Patient location: home Provider location: office  I spent a total of 5 minutes non-face-to-face activities for this visit on the date of this encounter including review of current clinical condition and response to treatment.    Venetia Night MD, [facility] Department of Neurosurgery

## 2023-07-24 IMAGING — RF DG LUMBAR SPINE 2-3V
1 series · 5 of 5 positions shown · non-contrast
Comparison: None.

CLINICAL DATA: L4-L5 decompression

EXAM:
LUMBAR SPINE - 2-3 VIEW

[Series 1: dg x-ray · 0.20mm/px · 5 of 5 slices shown]
[im 1/5]
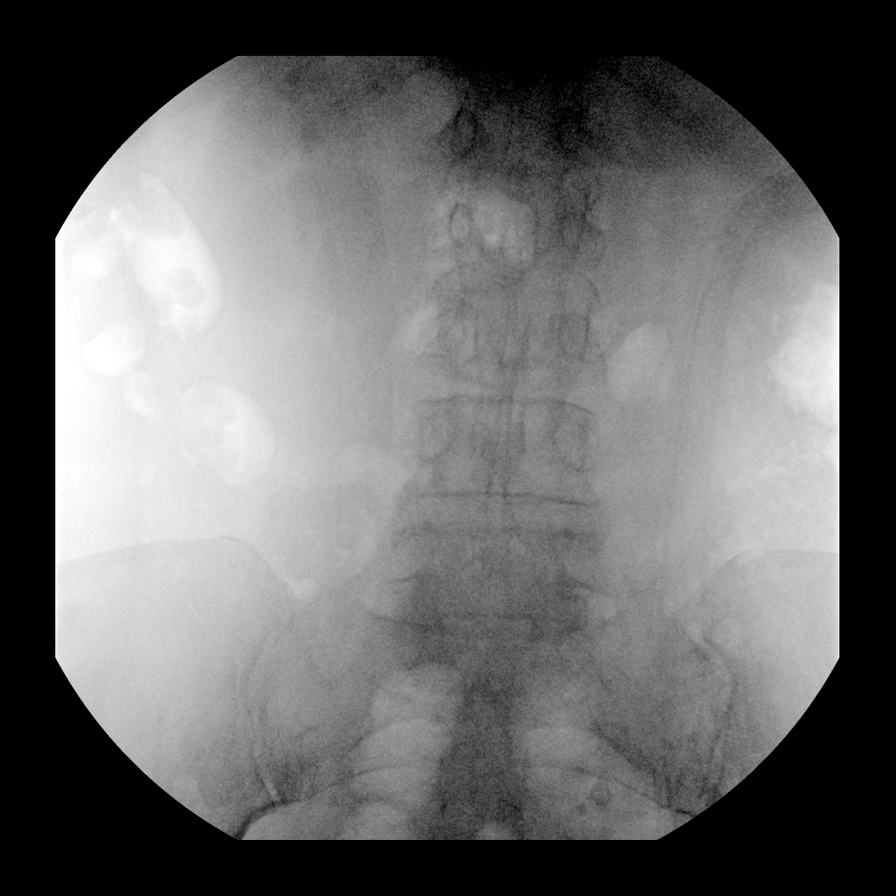
[im 2/5]
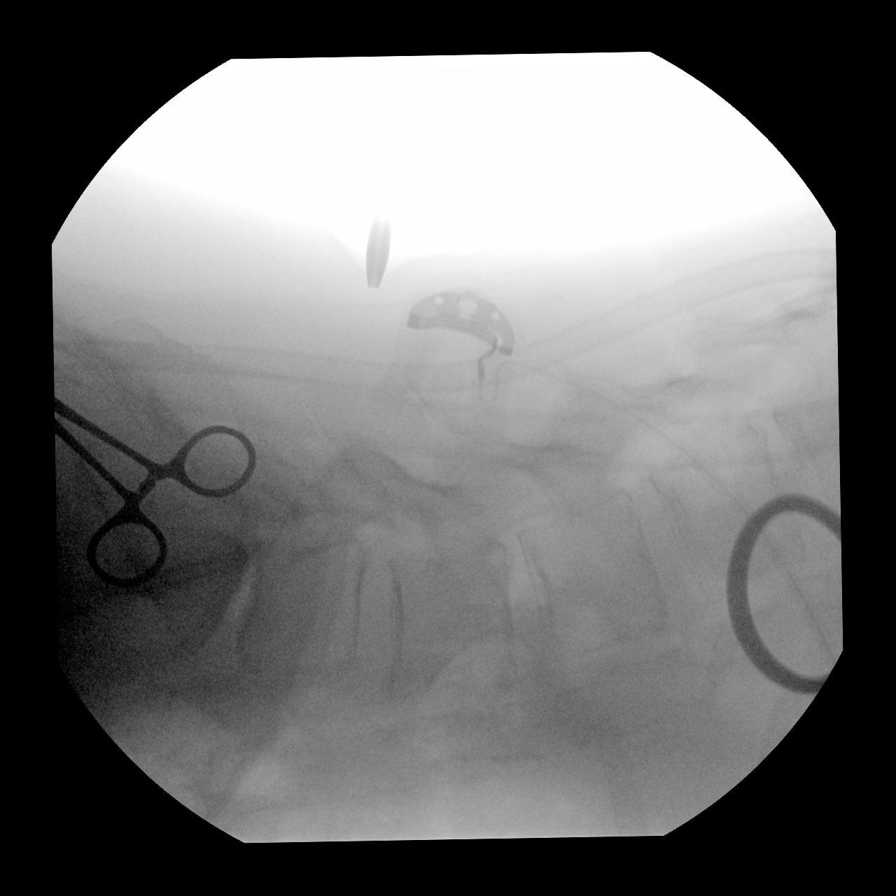
[im 3/5]
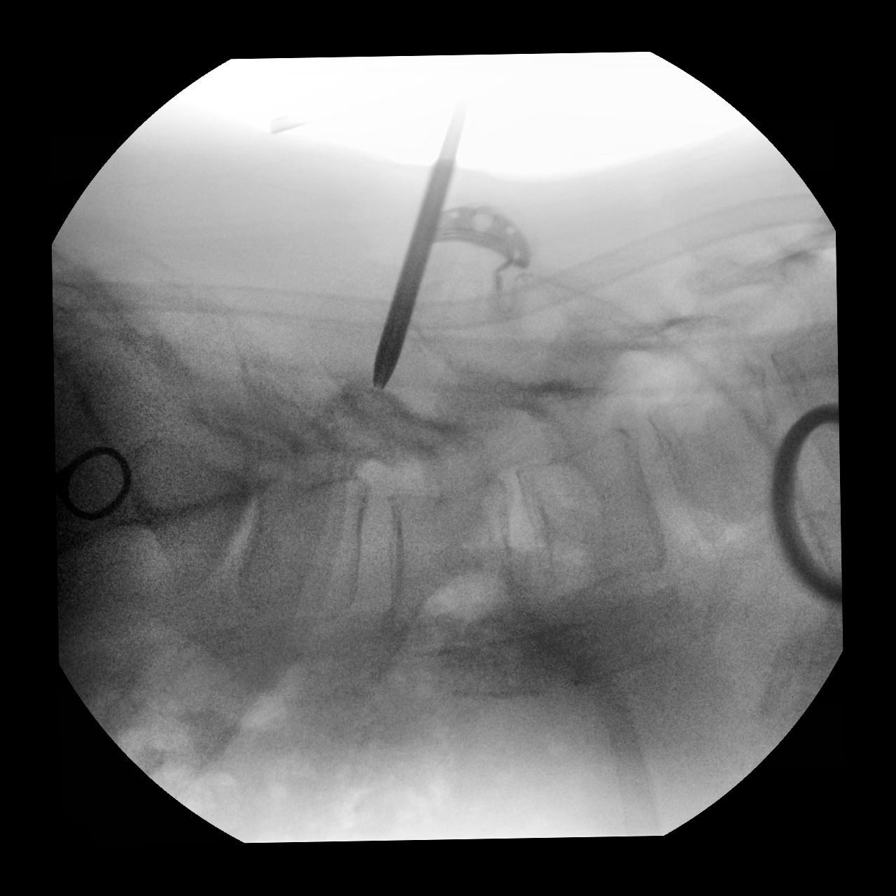
[im 4/5]
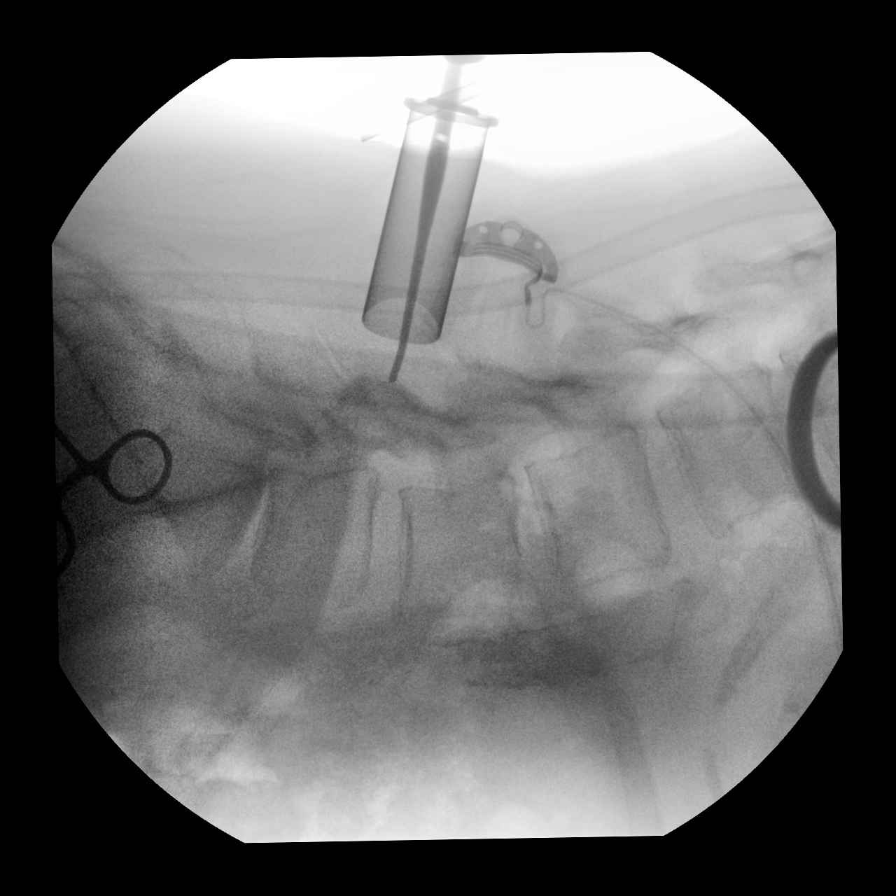
[im 5/5]
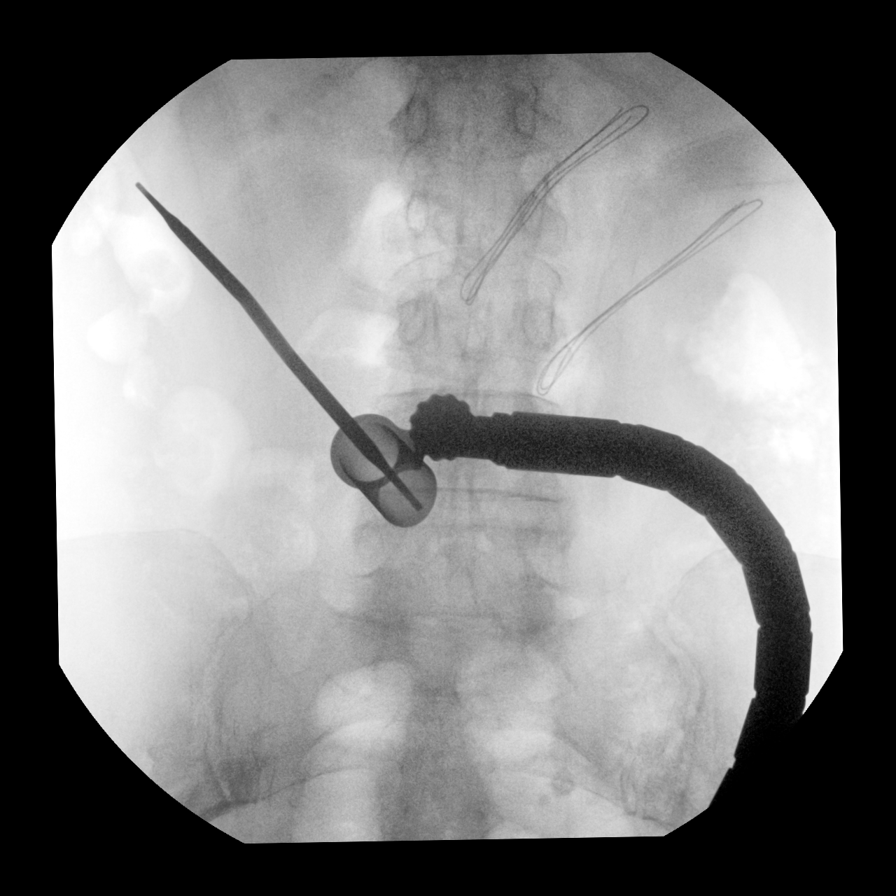

[5 of 5 positions shown; findings below may reference images not displayed]

FINDINGS: Five C-arm fluoroscopic images were obtained intraoperatively and
submitted for post operative interpretation. The second image is a
cross-table lateral view with surgical hardware projecting over the
skin over the presumed L5 level. Subsequent two images demonstrates
the hardware extending towards the L4-L5 disc space with probe
abutting the posterior aspect of the facet joint. The last image is
in AP projection with hardware overlying the L4 vertebral body and
L4-L5 disc space. Fluoro time 5 seconds. Please see the performing
provider's procedural report for further detail.
IMPRESSION: Intraoperative images during L4-L5 decompression as above.
